# Patient Record
Sex: Female | Born: 1975 | Race: Black or African American | Hispanic: No | Marital: Single | Smoking: Current every day smoker
Health system: Southern US, Community
[De-identification: ages and names within clinical notes are randomized; demographics above are authoritative.]

## PROBLEM LIST (undated history)

## (undated) DIAGNOSIS — F329 Major depressive disorder, single episode, unspecified: Secondary | ICD-10-CM

## (undated) DIAGNOSIS — F32A Depression, unspecified: Secondary | ICD-10-CM

## (undated) DIAGNOSIS — F419 Anxiety disorder, unspecified: Secondary | ICD-10-CM

---

## 2006-08-17 ENCOUNTER — Ambulatory Visit: Payer: Self-pay | Admitting: Obstetrics & Gynecology

## 2008-04-20 ENCOUNTER — Emergency Department: Payer: Self-pay | Admitting: Emergency Medicine

## 2008-05-07 ENCOUNTER — Emergency Department: Payer: Self-pay | Admitting: Emergency Medicine

## 2010-04-16 ENCOUNTER — Observation Stay: Payer: Self-pay | Admitting: Internal Medicine

## 2010-05-11 ENCOUNTER — Ambulatory Visit: Payer: Self-pay

## 2010-09-03 ENCOUNTER — Ambulatory Visit: Payer: Self-pay | Admitting: Neurology

## 2010-09-25 ENCOUNTER — Inpatient Hospital Stay: Payer: Self-pay | Admitting: Internal Medicine

## 2011-02-15 ENCOUNTER — Ambulatory Visit: Payer: Self-pay

## 2017-03-19 ENCOUNTER — Emergency Department: Payer: Self-pay

## 2017-03-19 ENCOUNTER — Emergency Department
Admission: EM | Admit: 2017-03-19 | Discharge: 2017-03-19 | Disposition: A | Discharge status: Home / Self Care | Payer: Self-pay | Attending: Emergency Medicine | Admitting: Emergency Medicine

## 2017-03-19 ENCOUNTER — Encounter: Payer: Self-pay | Admitting: Emergency Medicine

## 2017-03-19 DIAGNOSIS — F17200 Nicotine dependence, unspecified, uncomplicated: Secondary | ICD-10-CM | POA: Insufficient documentation

## 2017-03-19 DIAGNOSIS — N12 Tubulo-interstitial nephritis, not specified as acute or chronic: Secondary | ICD-10-CM | POA: Insufficient documentation

## 2017-03-19 DIAGNOSIS — R109 Unspecified abdominal pain: Secondary | ICD-10-CM | POA: Insufficient documentation

## 2017-03-19 HISTORY — DX: Anxiety disorder, unspecified: F41.9

## 2017-03-19 HISTORY — DX: Depression, unspecified: F32.A

## 2017-03-19 HISTORY — DX: Major depressive disorder, single episode, unspecified: F32.9

## 2017-03-19 LAB — URINALYSIS, COMPLETE (UACMP) WITH MICROSCOPIC
Bilirubin Urine: NEGATIVE
GLUCOSE, UA: NEGATIVE mg/dL
KETONES UR: NEGATIVE mg/dL
NITRITE: NEGATIVE
PH: 7 (ref 5.0–8.0)
Protein, ur: 30 mg/dL — AB
SPECIFIC GRAVITY, URINE: 1.006 (ref 1.005–1.030)

## 2017-03-19 LAB — POCT PREGNANCY, URINE: Preg Test, Ur: NEGATIVE

## 2017-03-19 MED ORDER — LEVOFLOXACIN 500 MG PO TABS: 500.0000 mg | ORAL_TABLET | Freq: Every day | ORAL | 0 refills | Status: AC

## 2017-03-19 MED ORDER — LEVOFLOXACIN 250 MG PO TABS
750.0000 mg | ORAL_TABLET | Freq: Once | ORAL | Status: AC
  Administered 2017-03-19: 750 mg via ORAL
  Filled 2017-03-19: qty 1

## 2017-03-19 MED ORDER — TRAMADOL HCL 50 MG PO TABS: 50.0000 mg | ORAL_TABLET | Freq: Two times a day (BID) | ORAL | 0 refills | Status: AC | PRN

## 2017-03-19 MED ORDER — TRAMADOL HCL 50 MG PO TABS
50.0000 mg | ORAL_TABLET | Freq: Once | ORAL | Status: AC
  Administered 2017-03-19: 50 mg via ORAL
  Filled 2017-03-19: qty 1

## 2017-03-19 NOTE — ED Provider Notes (Signed)
[facility] Emergency Department Provider Note  ____________________________________________  Time seen: Approximately 5:08 PM  I have reviewed the triage vital signs and the nursing notes.   HISTORY  Chief Complaint Urinary Tract Infection    HPI Terri Hanna is a 41 y.o. female presenting to the emergency department with dysuria, increased urinary frequency and hematuria for week. Patient states that she started her menstrual cycle today. Patient has been having right sided flank pain that is acute and 7 out of 10 in intensity. Patient denies a history of nephrolithiasis or pyelonephritis. Patient states that she has only had cystitis 1 other time in her life. She was diagnosed with an STD approximately 25 years ago. Patient denies fever, chills, nausea, vomiting and abdominal pain.   Past Medical History:  Diagnosis Date  . Anxiety   . Depression     There are no active problems to display for this patient.   No past surgical history on file.  Prior to Admission medications   Medication Sig Start Date End Date Taking? Authorizing Provider  levofloxacin (LEVAQUIN) 500 MG tablet Take 1 tablet (500 mg total) by mouth daily. 03/19/17 03/23/17  Orvil Feil, PA-C  traMADol (ULTRAM) 50 MG tablet Take 1 tablet (50 mg total) by mouth every 12 (twelve) hours as needed. 03/19/17 03/24/17  Orvil Feil, PA-C    Allergies Patient has no known allergies.  No family history on file.  Social History Social History  Substance Use Topics  . Smoking status: Current Every Day Smoker    Packs/day: 0.50  . Smokeless tobacco: Never Used  . Alcohol use Yes     Comment: weekends    Review of Systems  Constitutional: No fever/chills Eyes: No visual changes. No discharge ENT: No upper respiratory complaints. Cardiovascular: no chest pain. Respiratory: no cough. No SOB. Gastrointestinal: No abdominal pain.  No nausea, no vomiting.  No diarrhea.  No  constipation. Genitourinary: Patient has had right CVA tenderness, dysuria, hematuria and increased urinary frequency. Skin: Negative for rash, abrasions, lacerations, ecchymosis. Neurological: Negative for headaches, focal weakness or numbness. ____________________________________________   PHYSICAL EXAM:  VITAL SIGNS: ED Triage Vitals  Enc Vitals Group     BP 03/19/17 1524 111/72     Pulse Rate 03/19/17 1524 80     Resp 03/19/17 1524 18     Temp 03/19/17 1524 98.7 F (37.1 C)     Temp Source 03/19/17 1524 Oral     SpO2 03/19/17 1524 100 %     Weight 03/19/17 1524 130 lb (59 kg)     Height 03/19/17 1524  (1.727 m)     Head Circumference --      Peak Flow --      Pain Score 03/19/17 1528 5     Pain Loc --      Pain Edu? --      Excl. in GC? --      Constitutional: Alert and oriented. Well appearing and in no acute distress. Eyes: Conjunctivae are normal. PERRL. EOMI. Head: Atraumatic. Cardiovascular: Normal rate, regular rhythm. Normal S1 and S2.  Good peripheral circulation. Respiratory: Normal respiratory effort without tachypnea or retractions. Lungs CTAB. Good air entry to the bases with no decreased or absent breath sounds. Gastrointestinal: Bowel sounds 4 quadrants. Patient has suprapubic pain. No guarding or rigidity. No palpable masses. No distention. Patient has right-sided CVA tenderness. Musculoskeletal: Full range of motion to all extremities. No gross deformities appreciated. Neurologic:  Normal speech  and language. No gross focal neurologic deficits are appreciated.  Skin:  Skin is warm, dry and intact. No rash noted. Psychiatric: Mood and affect are normal. Speech and behavior are normal. Patient exhibits appropriate insight and judgement.  ____________________________________________   LABS (all labs ordered are listed, but only abnormal results are displayed)  Labs Reviewed  URINALYSIS, COMPLETE (UACMP) WITH MICROSCOPIC - Abnormal; Notable for the  following:       Result Value   Color, Urine YELLOW (*)    APPearance CLOUDY (*)    Hgb urine dipstick LARGE (*)    Protein, ur 30 (*)    Leukocytes, UA LARGE (*)    Bacteria, UA MANY (*)    Squamous Epithelial / LPF 0-5 (*)    All other components within normal limits  POC URINE PREG, ED  POCT PREGNANCY, URINE   ____________________________________________  EKG   ____________________________________________  RADIOLOGY   Ct Renal Stone Study  Result Date: 03/19/2017 CLINICAL DATA:  Right flank and lower abdominopelvic pain today. Dysuria. EXAM: CT ABDOMEN AND PELVIS WITHOUT CONTRAST TECHNIQUE: Multidetector CT imaging of the abdomen and pelvis was performed following the standard protocol without IV contrast. COMPARISON:  None. FINDINGS: Lower chest: No significant pulmonary nodules or acute consolidative airspace disease. Hepatobiliary: Normal liver with no liver mass. Normal gallbladder with no radiopaque cholelithiasis. No biliary ductal dilatation. Pancreas: Normal, with no mass or duct dilation. Spleen: Normal size. No mass. Adrenals/Urinary Tract: Normal adrenals. No right renal stones. No right hydronephrosis. There is urothelial wall thickening involving the right renal pelvis with associated surrounding fat stranding. No left hydronephrosis. Faint hyperdensity within lower left renal pyramids, cannot exclude punctate nonobstructing stones. No contour deforming renal masses. Slight haziness of the right perinephric fat. No discrete perinephric fluid collections. Normal caliber ureters, with no ureteral stones. No bladder stones. No definite bladder wall thickening. Stomach/Bowel: Grossly normal stomach. Normal caliber small bowel with no small bowel wall thickening. Normal appendix. Normal large bowel with no diverticulosis, large bowel wall thickening or pericolonic fat stranding. Vascular/Lymphatic: Atherosclerotic nonaneurysmal abdominal aorta. No pathologically enlarged lymph  nodes in the abdomen or pelvis. Reproductive: Grossly normal uterus. No adnexal mass. Tubal ligation clips are seen in the expected locations bilaterally. Other: No pneumoperitoneum, ascites or focal fluid collection. Musculoskeletal: No aggressive appearing focal osseous lesions. IMPRESSION: 1. Urothelial wall thickening in the right renal pelvis with surrounding fat stranding. Faint right perinephric fat haziness. Findings are suggestive of ascending right urinary tract infection with possible mild acute right pyelonephritis. 2. No hydronephrosis. No right renal stones. Possible punctate nonobstructing lower left renal stones. No ureteral or bladder stones. No definite bladder wall thickening. 3. Aortic atherosclerosis. Electronically Signed   By: Delbert Phenix M.D.   On: 03/19/2017 18:27    ____________________________________________    PROCEDURES  Procedure(s) performed:    Procedures    Medications  levofloxacin (LEVAQUIN) tablet 750 mg (not administered)  traMADol (ULTRAM) tablet 50 mg (50 mg Oral Given 03/19/17 1811)     ____________________________________________   INITIAL IMPRESSION / ASSESSMENT AND PLAN / ED COURSE  Pertinent labs & imaging results that were available during my care of the patient were reviewed by me and considered in my medical decision making (see chart for details).  Review of the [place] CSRS was performed in accordance of the [facility] prior to dispensing any controlled drugs.    Assessment and plan: Pyelonephritis Patient presents to the emergency department with dysuria, increased urinary frequency and hematuria for the past  week. CT renal stone study indicates findings consistent with right pyelonephritis. No obstructing renal stones were visualized on CT renal stone study. Vital signs are reassuring at this time. Patient will be treated as an outpatient. Patient was advised to return to the emergency department immediately if symptoms acutely worsen. Patient  voiced understanding regarding return precautions. All patient questions were answered. ____________________________________________  FINAL CLINICAL IMPRESSION(S) / ED DIAGNOSES  Final diagnoses:  Pyelonephritis      NEW MEDICATIONS STARTED DURING THIS VISIT:  New Prescriptions   LEVOFLOXACIN (LEVAQUIN) 500 MG TABLET    Take 1 tablet (500 mg total) by mouth daily.   TRAMADOL (ULTRAM) 50 MG TABLET    Take 1 tablet (50 mg total) by mouth every 12 (twelve) hours as needed.        This chart was dictated using voice recognition software/Dragon. Despite best efforts to proofread, errors can occur which can change the meaning. Any change was purely unintentional.    MELAYSIA STREED, PA-C 03/19/17 1849    Merrily Brittle, MD 03/19/17 2119

## 2017-03-19 NOTE — ED Notes (Signed)
Patient transported to CT 

## 2017-03-19 NOTE — ED Triage Notes (Signed)
Pt having "tingling" with urination x 1 week, and now burning with urination. States also started her menses today and having cramping

## 2017-03-19 NOTE — ED Notes (Signed)

## 2019-01-18 IMAGING — CT CT RENAL STONE PROTOCOL
3 of 4 series · 8 of 46 positions shown, 15 images · non-contrast
Comparison: None.

CLINICAL DATA: Right flank and lower abdominopelvic pain today.
Dysuria.

EXAM:
CT ABDOMEN AND PELVIS WITHOUT CONTRAST
TECHNIQUE: Multidetector CT imaging of the abdomen and pelvis was performed
following the standard protocol without IV contrast.

[Series 4: lung bases · axial · 0.70mm/px · z∈[-939,-879]mm · 4 of 21 slices shown, 9 images]
[im 5/21  soft-tissue]
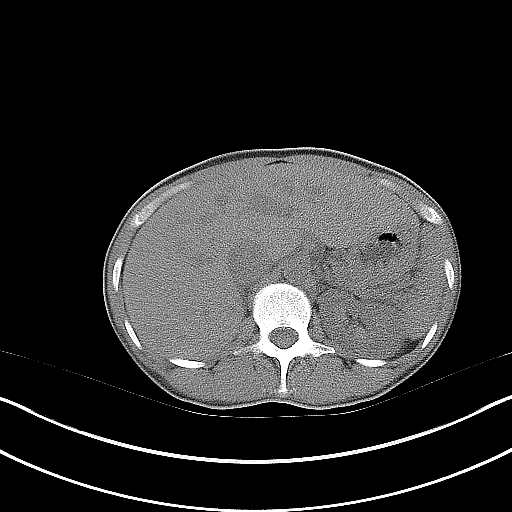
[im 5/21  lung]
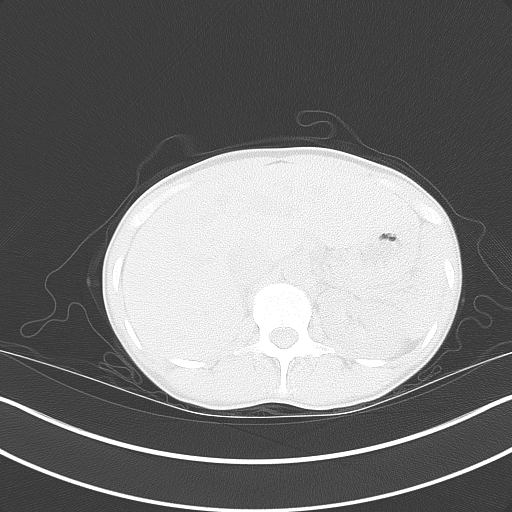
[im 5/21  bone]
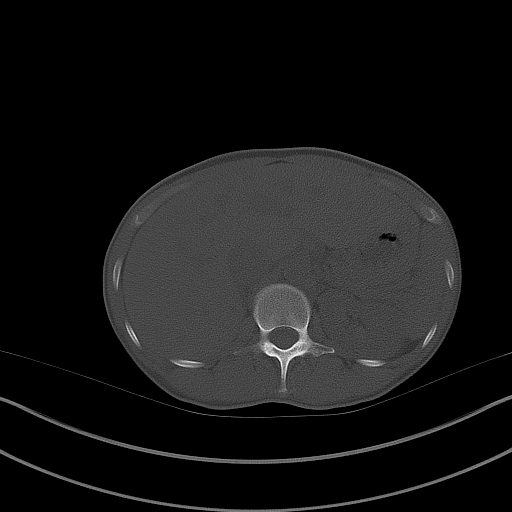
[im 9/21  soft-tissue]
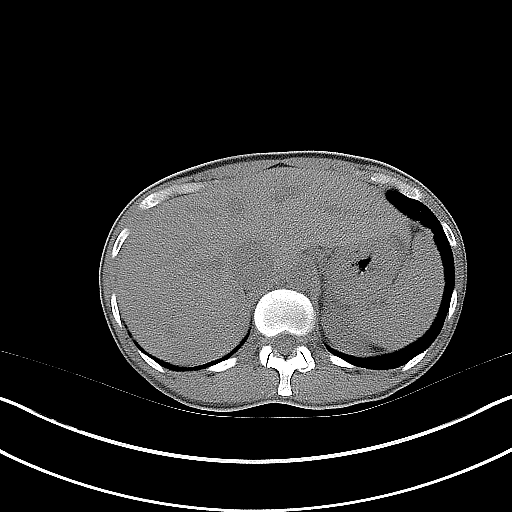
[im 9/21  lung]
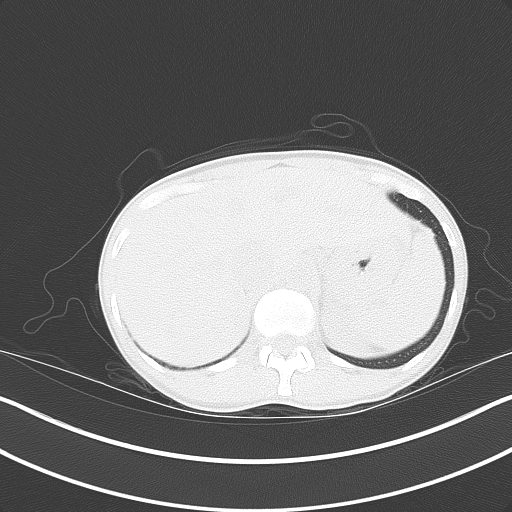
[im 13/21  soft-tissue]
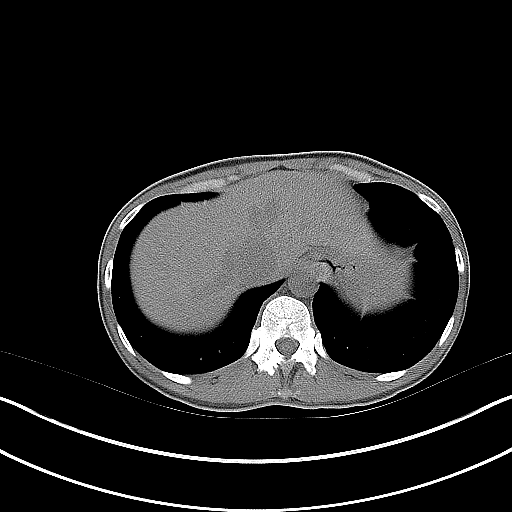
[im 13/21  lung]
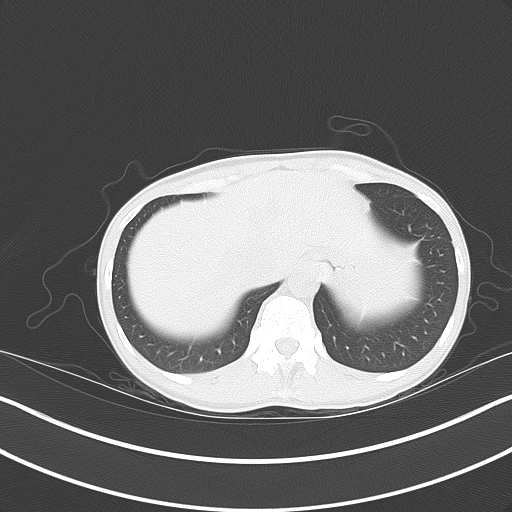
[im 17/21  soft-tissue]
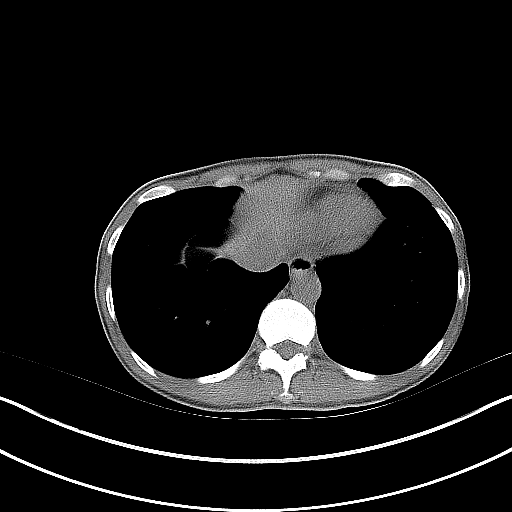
[im 17/21  lung]
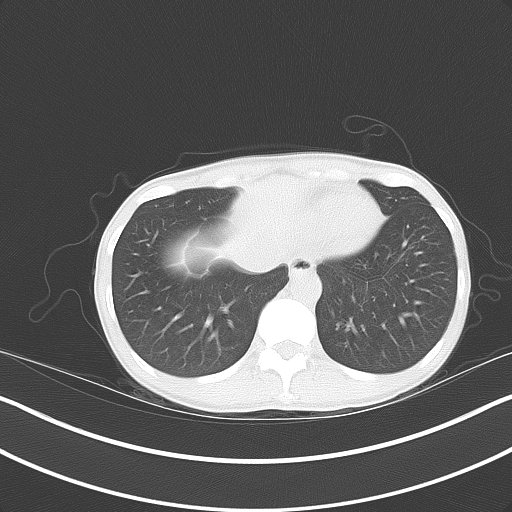

[Series 5: coronal · coronal · 0.67mm/px · 3 of 99 slices shown, 4 images]
[im 33/99  soft-tissue]
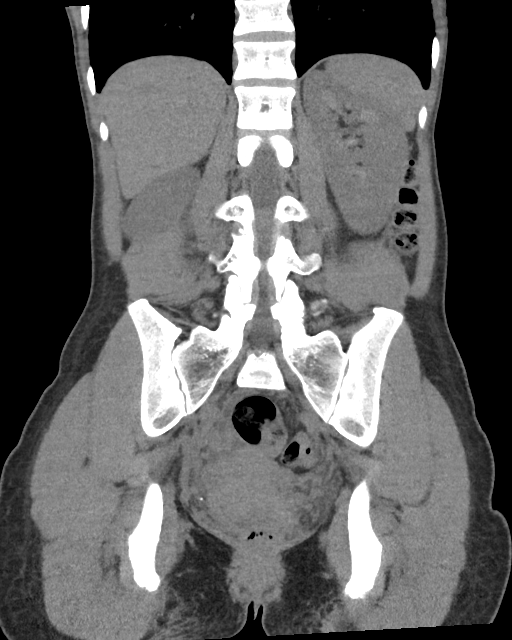
[im 44/99  soft-tissue]
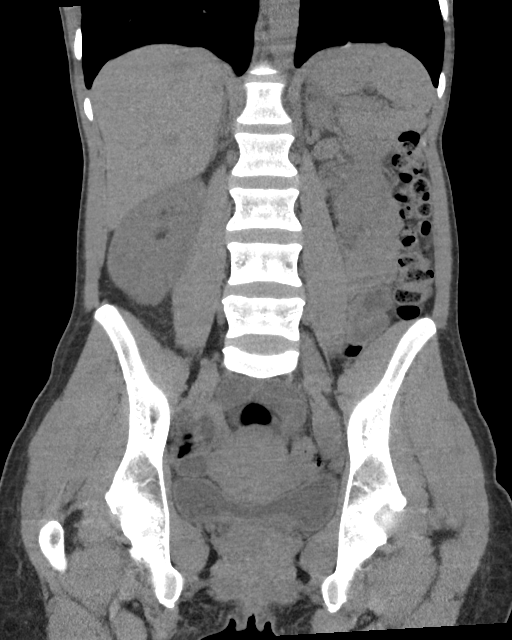
[im 44/99  bone]
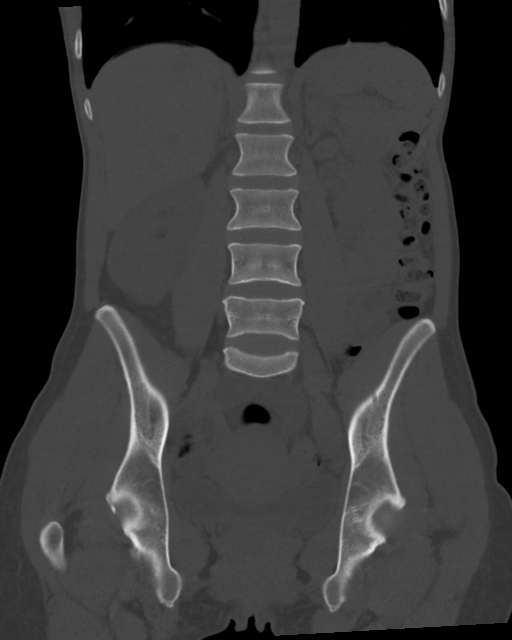
[im 55/99  soft-tissue]
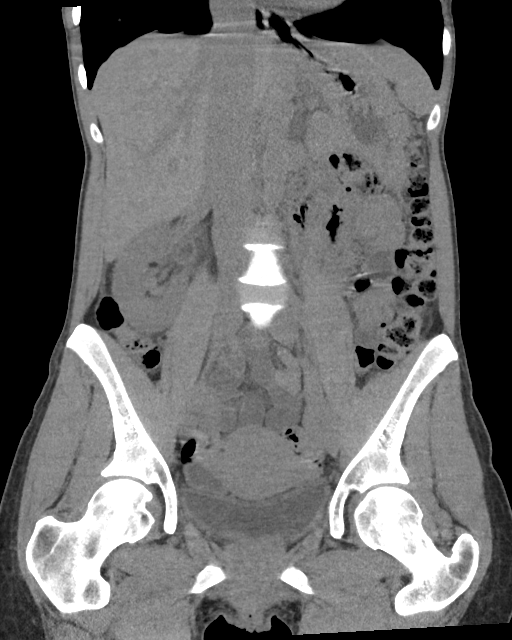

[Series 6: sagittal · sagittal · 0.43mm/px · 1 of 163 slices shown, 2 images]
[im 55/163  soft-tissue]
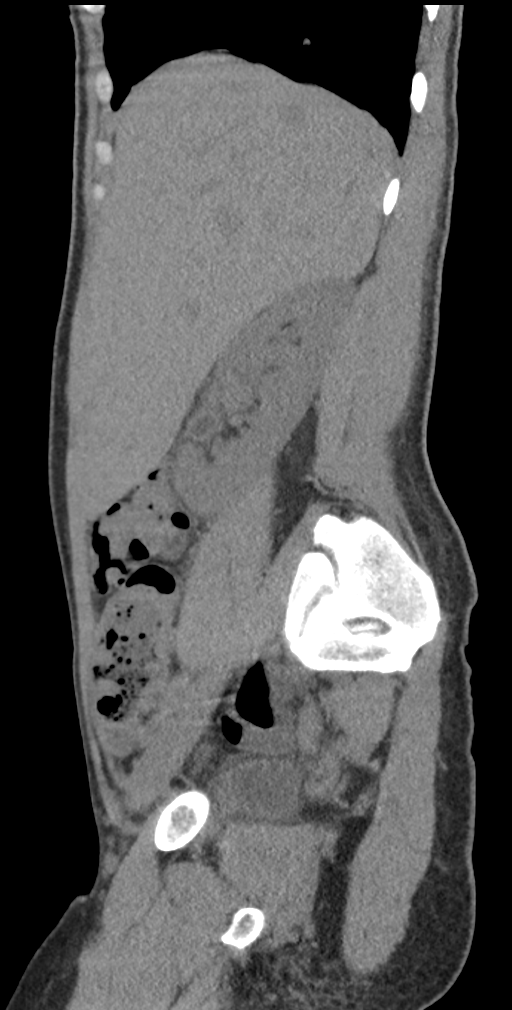
[im 55/163  bone]
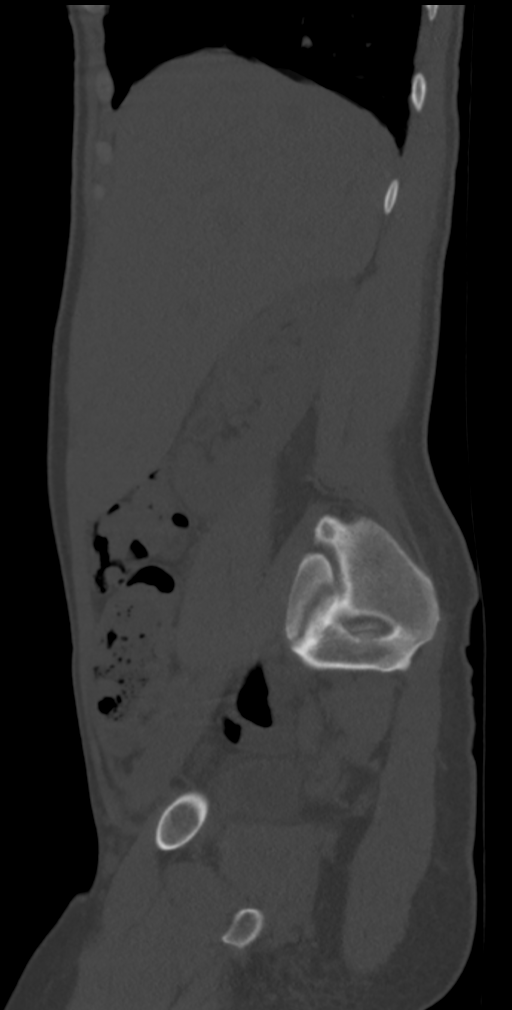

[8 of 46 positions shown; findings below may reference images not displayed]

FINDINGS: Lower chest: No significant pulmonary nodules or acute consolidative
airspace disease.

Hepatobiliary: Normal liver with no liver mass. Normal gallbladder
with no radiopaque cholelithiasis. No biliary ductal dilatation.

Pancreas: Normal, with no mass or duct dilation.

Spleen: Normal size. No mass.

Adrenals/Urinary Tract: Normal adrenals. No right renal stones. No
right hydronephrosis. There is urothelial wall thickening involving
the right renal pelvis with associated surrounding fat stranding. No
left hydronephrosis. Faint hyperdensity within lower left renal
pyramids, cannot exclude punctate nonobstructing stones. No contour
deforming renal masses. Slight haziness of the right perinephric
fat. No discrete perinephric fluid collections. Normal caliber
ureters, with no ureteral stones. No bladder stones. No definite
bladder wall thickening.

Stomach/Bowel: Grossly normal stomach. Normal caliber small bowel
with no small bowel wall thickening. Normal appendix. Normal large
bowel with no diverticulosis, large bowel wall thickening or
pericolonic fat stranding.

Vascular/Lymphatic: Atherosclerotic nonaneurysmal abdominal aorta.
No pathologically enlarged lymph nodes in the abdomen or pelvis.

Reproductive: Grossly normal uterus. No adnexal mass. Tubal ligation
clips are seen in the expected locations bilaterally.

Other: No pneumoperitoneum, ascites or focal fluid collection.

Musculoskeletal: No aggressive appearing focal osseous lesions.
IMPRESSION: 1. Urothelial wall thickening in the right renal pelvis with
surrounding fat stranding. Faint right perinephric fat haziness.
Findings are suggestive of ascending right urinary tract infection
with possible mild acute right pyelonephritis.
2. No hydronephrosis. No right renal stones. Possible punctate
nonobstructing lower left renal stones. No ureteral or bladder
stones. No definite bladder wall thickening.
3. Aortic atherosclerosis.

## 2021-03-04 ENCOUNTER — Other Ambulatory Visit: Payer: Self-pay | Admitting: Family Medicine

## 2021-03-04 ENCOUNTER — Ambulatory Visit: Payer: Self-pay | Admitting: Family Medicine

## 2021-03-04 ENCOUNTER — Other Ambulatory Visit: Payer: Self-pay

## 2021-03-04 ENCOUNTER — Encounter: Payer: Self-pay | Admitting: Family Medicine

## 2021-03-04 DIAGNOSIS — A599 Trichomoniasis, unspecified: Secondary | ICD-10-CM

## 2021-03-04 DIAGNOSIS — Z8659 Personal history of other mental and behavioral disorders: Secondary | ICD-10-CM

## 2021-03-04 DIAGNOSIS — Z113 Encounter for screening for infections with a predominantly sexual mode of transmission: Secondary | ICD-10-CM

## 2021-03-04 LAB — HEPATITIS B SURFACE ANTIGEN

## 2021-03-04 LAB — WET PREP FOR TRICH, YEAST, CLUE
Trichomonas Exam: POSITIVE — AB
Yeast Exam: NEGATIVE

## 2021-03-04 LAB — HIV ANTIBODY (ROUTINE TESTING W REFLEX): HIV 1&2 Ab, 4th Generation: NONREACTIVE

## 2021-03-04 LAB — HM HEPATITIS C SCREENING LAB: HM Hepatitis Screen: NEGATIVE

## 2021-03-04 MED ORDER — METRONIDAZOLE 500 MG PO TABS: 500.0000 mg | ORAL_TABLET | Freq: Two times a day (BID) | ORAL | 0 refills | Status: AC

## 2021-03-04 NOTE — Addendum Note (Signed)
Addended by: Wendi Snipes on: 03/04/2021 04:21 PM   Modules accepted: Orders

## 2021-03-04 NOTE — Progress Notes (Signed)
[facility] Department STI clinic/screening visit  Subjective:  Terri Hanna is a 45 y.o. female being seen today for an STI screening visit. The patient reports they do not have symptoms.  Patient reports that they do not desire a pregnancy in the next year.   They reported they are not interested in discussing contraception today.  No LMP recorded. (Menstrual status: Other).   Patient has the following medical conditions:  There are no problems to display for this patient.   Chief Complaint  Patient presents with  . SEXUALLY TRANSMITTED DISEASE    Referral from [facility] + RPR     HPI  Patient reports here d/t positive RPR from [facility].    Last HIV test per patient/review of record was 17 yrs ago  Patient reports last pap was about 17 years ago.   See flowsheet for further details and programmatic requirements.    The following portions of the patient's history were reviewed and updated as appropriate: allergies, current medications, past medical history, past social history, past surgical history and problem list.  Objective:  There were no vitals filed for this visit.  Physical Exam Vitals and nursing note reviewed.  Constitutional:      Appearance: Normal appearance.  HENT:     Head: Normocephalic and atraumatic.     Mouth/Throat:     Mouth: Mucous membranes are moist.     Pharynx: Oropharynx is clear. No oropharyngeal exudate or posterior oropharyngeal erythema.     Comments: Teeth missing, poor hygiene.   Eyes:     Comments: Left eye prosthesis   Pulmonary:     Effort: Pulmonary effort is normal.  Chest:  Breasts:     Right: No axillary adenopathy or supraclavicular adenopathy.     Left: No axillary adenopathy or supraclavicular adenopathy.    Abdominal:     General: Abdomen is flat.     Palpations: Abdomen is soft. There is no mass.     Tenderness: There is no abdominal tenderness. There is no rebound.     Comments: Abdominal jewelry  present   Genitourinary:    General: Normal vulva.     Exam position: Lithotomy position.     Pubic Area: No rash or pubic lice.      Labia:        Right: No rash or lesion.        Left: No rash or lesion.      Vagina: Normal. No vaginal discharge, erythema, bleeding or lesions.     Cervix: No cervical motion tenderness, discharge, friability, lesion or erythema.     Uterus: Normal.      Adnexa: Right adnexa normal and left adnexa normal.     Rectum: Normal.     Comments: External genitalia without, lice, nits, erythema, edema , lesions or inguinal adenopathy. Evidence of clitoral trauma with likely sloughing skin from prior edema. No open wounds/lacerations. Healing hematoma of clitoral hood and superior labia majora present.  Vagina with normal mucosa and off white discharge and pH >4.  Cervix without visual lesions, uterus firm, mobile, non-tender, no masses, CMT adnexal fullness or tenderness.   Musculoskeletal:     Cervical back: Normal range of motion and neck supple.  Lymphadenopathy:     Head:     Right side of head: No preauricular or posterior auricular adenopathy.     Left side of head: No preauricular or posterior auricular adenopathy.     Cervical: No cervical adenopathy.  Upper Body:     Right upper body: No supraclavicular or axillary adenopathy.     Left upper body: No supraclavicular or axillary adenopathy.     Lower Body: No right inguinal adenopathy. No left inguinal adenopathy.  Skin:    General: Skin is warm and dry.     Findings: No rash.  Neurological:     Mental Status: She is alert and oriented to person, place, and time.  Psychiatric:     Comments: Tearful       Assessment and Plan:  Terri Hanna is a 45 y.o. female presenting to the [facility] Department for STI screening  1. Screening examination for venereal disease Patient is in clinic d/t letter from [place] [facility] with positive RPR.  Discussed with patient, if lab is positive,  will get a call from the state and will contact for treatment.    - Chlamydia/Gonorrhea [facility] - HIV/HCV [place] [facility] - HBV Antigen/Antibody [facility] - Syphilis Serology,  Lab - WET PREP FOR TRICH, YEAST, CLUE  2. History of depression Patient reports history of depression and unable to stay on medications d/t loss of medicaid.   - Ambulatory referral to Behavioral Health  3. Trichimoniasis Wet prep + for Trich.   - metroNIDAZOLE (FLAGYL) 500 MG tablet; Take 1 tablet (500 mg total) by mouth 2 (two) times daily for 7 days.  Dispense: 14 tablet; Refill: 0   Patient accepted all screenings including oral, vaginal CT/GC and bloodwork for HIV/RPR/ HBV/ HCV.  Patient meets criteria for HepB screening? Yes. Ordered? Yes Patient meets criteria for HepC screening? Yes. Ordered? Yes  Wet prep results + for Trich    RN: treat wet prep it + Amine or Clue.  Will wait on rpr treatment until test results are back.  Make sure that phone number is correct.    Discussed time line for State Lab results and that patient will be called with positive results and encouraged patient to call if she had not heard in 2 weeks.  Counseled to return or seek care for continued or worsening symptoms Recommended condom use with all sex  Patient is currently using BTL to prevent pregnancy.      Return for as needed.  No future appointments.  Wendi Snipes, FNP

## 2021-03-09 LAB — GONOCOCCUS CULTURE

## 2021-03-10 NOTE — Progress Notes (Signed)
RN abstracted HIV and Hep C results. Nunzio Banet, RN  

## 2021-03-10 NOTE — Progress Notes (Signed)
RN abstracted Hep B results. Bryna Razavi, RN  

## 2021-03-12 ENCOUNTER — Telehealth: Payer: Self-pay

## 2021-03-12 ENCOUNTER — Telehealth: Payer: Self-pay | Admitting: Family Medicine

## 2021-03-12 NOTE — Telephone Encounter (Signed)
Call to patient, in regards to reactive syphilis result from 03/04/2021. No answer. LMOM to return call at [phone]. RN spoke with Melton Alar (DIS) she is going to do a home visit today and has tried calling and texting patient several times.   Harvie Heck, RN

## 2021-03-12 NOTE — Telephone Encounter (Signed)
Pt was returning call from nurse to discuss STD results

## 2021-03-12 NOTE — Telephone Encounter (Signed)
Call returned to patient. Patient has already spoken with Melton Alar (DIS) and was informed of positive RPR. Patient scheduled for IS appt 03/16/2021. Patient also asked about mental health referral. I confirmed with her that was sent. If she has not heard anything by her appt we can address that then. All questions answered.  Harvie Heck, RN

## 2021-03-16 ENCOUNTER — Ambulatory Visit: Payer: Self-pay

## 2021-03-22 ENCOUNTER — Ambulatory Visit: Payer: Self-pay | Admitting: Physician Assistant

## 2021-03-22 ENCOUNTER — Other Ambulatory Visit: Payer: Self-pay

## 2021-03-22 DIAGNOSIS — A539 Syphilis, unspecified: Secondary | ICD-10-CM

## 2021-03-22 MED ORDER — DOXYCYCLINE HYCLATE 100 MG PO TABS
100.0000 mg | ORAL_TABLET | Freq: Two times a day (BID) | ORAL | 0 refills | Status: AC
Start: 2021-03-22 — End: 2021-04-19

## 2021-03-23 ENCOUNTER — Encounter: Payer: Self-pay | Admitting: Physician Assistant

## 2021-03-23 DIAGNOSIS — A539 Syphilis, unspecified: Secondary | ICD-10-CM

## 2021-03-23 HISTORY — DX: Syphilis, unspecified: A53.9

## 2021-03-23 NOTE — Progress Notes (Signed)
S:  Patient RTC for treatment of Syphilis today.  States that she was sent here after her screening test at the plasma center where she was going to donate came back positive for Syphilis.  Reports that her last sex was in early February and that she has had a BTL for [facility].  States allergy to PCN and Sulfa medications but does not know what she has as a reaction. O:  WDWN female in NAD, A&O x 3, normal work of breathing. RPR from last visit reactive 1:128 and confirmatory testing was reactive. A/P:  1.  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 2.  Due to allergy and unknown reaction to PCN will give Doxycycline 100 mg #56 1 po BID for 4 weeks. The patient was dispensed Doxycycline today. I provided counseling today regarding the medication. We discussed the medication, the side effects and when to call clinic. Patient given the opportunity to ask questions. Questions answered.  3.  No sex for 4 weeks and until after partner completes treatment. 4.  Enc condoms with all sex for STD protection. 5.  RTC 6 months after completing medicine for titer check. 6.  Call with questions or concerns.

## 2021-05-12 NOTE — Addendum Note (Signed)
Addended by: Heywood Bene on: 05/12/2021 03:41 PM   Modules accepted: Orders

## 2021-07-20 ENCOUNTER — Other Ambulatory Visit: Payer: Self-pay

## 2021-07-20 ENCOUNTER — Emergency Department
Admission: EM | Admit: 2021-07-20 | Discharge: 2021-07-20 | Disposition: A | Discharge status: Home / Self Care | Payer: Self-pay | Attending: Emergency Medicine | Admitting: Emergency Medicine

## 2021-07-20 ENCOUNTER — Encounter: Payer: Self-pay | Admitting: Emergency Medicine

## 2021-07-20 DIAGNOSIS — F17210 Nicotine dependence, cigarettes, uncomplicated: Secondary | ICD-10-CM | POA: Insufficient documentation

## 2021-07-20 DIAGNOSIS — K047 Periapical abscess without sinus: Secondary | ICD-10-CM

## 2021-07-20 DIAGNOSIS — L0291 Cutaneous abscess, unspecified: Secondary | ICD-10-CM

## 2021-07-20 LAB — CBC WITH DIFFERENTIAL/PLATELET
Abs Immature Granulocytes: 0.02 10*3/uL (ref 0.00–0.07)
Basophils Absolute: 0 10*3/uL (ref 0.0–0.1)
Basophils Relative: 0 %
Eosinophils Absolute: 0.1 10*3/uL (ref 0.0–0.5)
Eosinophils Relative: 1 %
HCT: 39.1 % (ref 36.0–46.0)
Hemoglobin: 12.4 g/dL (ref 12.0–15.0)
Immature Granulocytes: 0 %
Lymphocytes Relative: 29 %
Lymphs Abs: 2 10*3/uL (ref 0.7–4.0)
MCH: 23.1 pg — ABNORMAL LOW (ref 26.0–34.0)
MCHC: 31.7 g/dL (ref 30.0–36.0)
MCV: 72.8 fL — ABNORMAL LOW (ref 80.0–100.0)
Monocytes Absolute: 0.4 10*3/uL (ref 0.1–1.0)
Monocytes Relative: 6 %
Neutro Abs: 4.5 10*3/uL (ref 1.7–7.7)
Neutrophils Relative %: 64 %
Platelets: 227 10*3/uL (ref 150–400)
RBC: 5.37 MIL/uL — ABNORMAL HIGH (ref 3.87–5.11)
RDW: 14.5 % (ref 11.5–15.5)
WBC: 7 10*3/uL (ref 4.0–10.5)
nRBC: 0 % (ref 0.0–0.2)

## 2021-07-20 LAB — BASIC METABOLIC PANEL
Anion gap: 6 (ref 5–15)
BUN: 10 mg/dL (ref 6–20)
CO2: 29 mmol/L (ref 22–32)
Calcium: 9.1 mg/dL (ref 8.9–10.3)
Chloride: 104 mmol/L (ref 98–111)
Creatinine, Ser: 0.63 mg/dL (ref 0.44–1.00)
GFR, Estimated: >60 mL/min (ref >60)
Glucose, Bld: 94 mg/dL (ref 70–99)
Potassium: 3.8 mmol/L (ref 3.5–5.1)
Sodium: 139 mmol/L (ref 135–145)

## 2021-07-20 MED ORDER — CLINDAMYCIN HCL 150 MG PO CAPS: 450.0000 mg | ORAL_CAPSULE | Freq: Three times a day (TID) | ORAL | 0 refills | Status: AC

## 2021-07-20 MED ORDER — OXYCODONE HCL 5 MG PO TABS: 5.0000 mg | ORAL_TABLET | Freq: Four times a day (QID) | ORAL | 0 refills | Status: AC | PRN

## 2021-07-20 MED ORDER — CLINDAMYCIN HCL 150 MG PO CAPS
450.0000 mg | ORAL_CAPSULE | Freq: Once | ORAL | Status: AC
  Administered 2021-07-20: 450 mg via ORAL
  Filled 2021-07-20: qty 3

## 2021-07-20 NOTE — ED Provider Notes (Signed)
[facility] Emergency Department Provider Note  ____________________________________________   Event Date/Time   First MD Initiated Contact with Patient 07/20/21 2126     (approximate)  I have reviewed the triage vital signs and the nursing notes.   HISTORY  Chief Complaint Abscess    HPI Terri Hanna is a 45 y.o. female with anxiety, depression, prior syphilis who comes in with concern for swelling on the bottom right upper jaw.  Patient reports having a tooth that fell out over a year ago.  She states that today that she woke up and she had swelling to the right side of her jaw.  She opens her mouth and points to inside of her mouth says that it is really fluctuant there.  Reports some pain is constant, worse with opening her mouth, moderate.  She denies any difficulty swallowing.  Denies any alcohol or drug use except for marijuana occasionally.           Past Medical History:  Diagnosis Date   Anxiety    Depression    Syphilis 03/23/2021   Syphilis of unknown duration with Reactive RPR and confirmatory testing.  Due to reported allergy and unknown reaction patient treated with Doxycycline 100 mg #56 1 po BID for 28d.    Patient Active Problem List   Diagnosis Date Noted   Syphilis 03/23/2021    History reviewed. No pertinent surgical history.  Prior to Admission medications   Not on File    Allergies Penicillins and Sulfa antibiotics  No family history on file.  Social History Social History   Tobacco Use   Smoking status: Every Day    Packs/day: 0.50    Types: Cigarettes   Smokeless tobacco: Never  Vaping Use   Vaping Use: Never used  Substance Use Topics   Alcohol use: Yes    Alcohol/week: 1.0 standard drink    Types: 1 Glasses of wine per week    Comment: weekends   Drug use: Yes    Frequency: 3.0 times per week    Types: Marijuana      Review of Systems Constitutional: No fever/chills Eyes: No visual  changes. ENT: No sore throat. Cardiovascular: Denies chest pain. Respiratory: Denies shortness of breath. Gastrointestinal: No abdominal pain.  No nausea, no vomiting.  No diarrhea.  No constipation. Genitourinary: Negative for dysuria. Musculoskeletal: Negative for back pain. Skin: Negative for rash. Neurological: Negative for headaches, focal weakness or numbness. All other ROS negative ____________________________________________   PHYSICAL EXAM:  VITAL SIGNS: ED Triage Vitals  Enc Vitals Group     BP 07/20/21 1927 (!) 120/95     Pulse Rate 07/20/21 1927 87     Resp 07/20/21 1927 18     Temp 07/20/21 1927 98.6 F (37 C)     Temp Source 07/20/21 1927 Oral     SpO2 07/20/21 1927 100 %     Weight 07/20/21 1926 125 lb (56.7 kg)     Height 07/20/21 1926 5\' 8"  (1.727 m)     Head Circumference --      Peak Flow --      Pain Score 07/20/21 1926 7     Pain Loc --      Pain Edu? --      Excl. in GC? --     Constitutional: Alert and oriented. Well appearing and in no acute distress. Eyes: Conjunctivae are normal. EOMI. Head: Atraumatic. Nose: No congestion/rhinnorhea. Mouth/Throat: Mucous membranes are moist.  Patient has  absent tooth noted on the back right molar with large fluctuant mass noted.  Apically.  There is a little bit of swelling of the right side of the jaw but does not extend into the mandible. Neck: No stridor. Trachea Midline. FROM Cardiovascular: Normal rate, regular rhythm. Grossly normal heart sounds.  Good peripheral circulation. Respiratory: Normal respiratory effort.  No retractions. Lungs CTAB. Gastrointestinal: Soft and nontender. No distention. No abdominal bruits.  Musculoskeletal: No lower extremity tenderness nor edema.  No joint effusions. Neurologic:  Normal speech and language. No gross focal neurologic deficits are appreciated.  Skin:  Skin is warm, dry and intact. No rash noted. Psychiatric: Mood and affect are normal. Speech and behavior are  normal. GU: Deferred   ____________________________________________   LABS (all labs ordered are listed, but only abnormal results are displayed)  Labs Reviewed  CBC WITH DIFFERENTIAL/PLATELET - Abnormal; Notable for the following components:      Result Value   RBC 5.37 (*)    MCV 72.8 (*)    MCH 23.1 (*)    All other components within normal limits  BASIC METABOLIC PANEL   _____  PROCEDURES  Procedure(s) performed (including Critical Care):  Marland KitchenMarland KitchenIncision and Drainage  Date/Time: 07/20/2021 10:40 PM Performed by: Concha Se, MD Authorized by: Concha Se, MD   Consent:    Consent obtained:  Verbal   Consent given by:  Patient   Risks discussed:  Bleeding, damage to other organs, infection, incomplete drainage and pain   Alternatives discussed:  No treatment Location:    Type:  Abscess   Location:  Mouth   Mouth location:  Alveolar process Procedure type:    Complexity:  Simple Procedure details:    Incision types:  Stab incision   Drainage:  Purulent   Packing materials:  None Post-procedure details:    Procedure completion:  Tolerated   ____________________________________________   INITIAL IMPRESSION / ASSESSMENT AND PLAN / ED COURSE  [name] was evaluated in Emergency Department on 07/20/2021 for the symptoms described in the history of present illness. She was evaluated in the context of the global COVID-19 pandemic, which necessitated consideration that the patient might be at risk for infection with the SARS-CoV-2 virus that causes COVID-19. Institutional protocols and algorithms that pertain to the evaluation of patients at risk for COVID-19 are in a state of rapid change based on information released by regulatory bodies including the CDC and federal and state organizations. These policies and algorithms were followed during the patient's care in the ED.    Exam consistent w/ a periapical abscess.  Pretty impressive size and discussed CT imaging  to make sure it did not go up any other spaces that would require IV antibiotics.  Patient is adamant that she do not want a CT scan.  She states that she cannot stay any longer.  There is no evidence of Ludwick's angina and there is an obvious fluctuation that she would prefer that I just incise rather than do the CT scan.  She understands the risk of missing deeper infections but she is afebrile and white count was normal so it seems less likely for something more severe and her exam is consistent with a periapical abscess.  I did do an I&D with purulent discharge and patient reports immediate relief.  We will treat patient with a course of clindamycin due to penicillin allergy.  Patient was instructed to follow-up with dentistry and I gave her a few oxycodone to take  to help with pain.  Understands not to drive or work while on the        ____________________________________________   FINAL CLINICAL IMPRESSION(S) / ED DIAGNOSES   Final diagnoses:  Abscess  Dental abscess      MEDICATIONS GIVEN DURING THIS VISIT:  Medications - No data to display   ED Discharge Orders          Ordered    clindamycin (CLEOCIN) 150 MG capsule  3 times daily        07/20/21 2244    oxyCODONE (ROXICODONE) 5 MG immediate release tablet  Every 6 hours PRN        07/20/21 2244             Note:  This document was prepared using Dragon voice recognition software and may include unintentional dictation errors.    Concha Se, MD 07/20/21 [phone]

## 2021-07-20 NOTE — ED Triage Notes (Signed)
Pt states she woke up this morning and her bottom right jaw was swollen, pt states since the swelling has gone down some but she has pain to the area. Pt states last year she "ate her tooth off" and that is where the swelling is coming from now. Pt denies having any issues with tooth in the past. Pt denies difficulty breathing, states she has some difficulty swallowing. Pt able to speak in complete sentences not distress noted.

## 2021-07-20 NOTE — Discharge Instructions (Addendum)
You need to follow-up emergently with the dental service.  You can take the antibiotics to help.  Take Tylenol 1 g every 8 hours, ibuprofen to help with pain you can use the oxycodone for breakthrough pain.  Return to the ER if develop fevers, worsening swelling or any other concerns  Take oxycodone as prescribed. Do not drink alcohol, drive or participate in any other potentially dangerous activities while taking this medication as it may make you sleepy. Do not take this medication with any other sedating medications, either prescription or over-the-counter. If you were prescribed Percocet or Vicodin, do not take these with acetaminophen (Tylenol) as it is already contained within these medications.  This medication is an opiate (or narcotic) pain medication and can be habit forming. Use it as little as possible to achieve adequate pain control. Do not use or use it with extreme caution if you have a history of opiate abuse or dependence. If you are on a pain contract with your primary care doctor or a pain specialist, be sure to let them know you were prescribed this medication today from the Emergency Department. This medication is intended for your use only - do not give any to anyone else and keep it in a secure place where nobody else, especially children, have access to it.    OPTIONS FOR DENTAL FOLLOW UP CARE  [place] Department of Health and Human Services - Local Safety Net Dental Clinics [email or web address]   [facility] [phone])  Sharl Ma [phone])  [facility] [phone])  [facility] [phone])  [facility] [phone]) This clinic caters to the indigent population and is on a lottery system. Location: [employer] of Dentistry, [employer], [address] [place] Clinic Hours: Wednesdays from 6pm - 9pm, patients seen by a lottery  system. For dates, call or go to [email or web address] Services: Cleanings, fillings and simple extractions. Payment Options: DENTAL WORK IS FREE OF CHARGE. Bring proof of income or support. Best way to get seen: Arrive at 5:15 pm - this is a lottery, NOT first come/first serve, so arriving earlier will not increase your chances of being seen.     [facility] [phone] Select option 1 for emergencies   Location: [facility] of Dentistry, [place], [address] [place] Clinic Hours: No walk-ins accepted - call the day before to schedule an appointment. Check in times are 9:30 am and 1:30 pm. Services: Simple extractions, temporary fillings, pulpectomy/pulp debridement, uncomplicated abscess drainage. Payment Options: PAYMENT IS DUE AT THE TIME OF SERVICE.  Fee is usually $100-200, additional surgical procedures (e.g. abscess drainage) may be extra. Cash, checks, Visa/MasterCard accepted.  Can file Medicaid if patient is covered for dental - patient should call case worker to check. No discount for [facility] patients. Best way to get seen: MUST call the day before and get onto the schedule. Can usually be seen the next 1-2 days. No walk-ins accepted.     [facility] [phone]   Location: [facility], [address] [place] Clinic Hours: M, W, Th, F 8am or 1:30pm, Tues 9a or 1:30 - first come/first served. Services: Simple extractions, temporary fillings, uncomplicated abscess drainage.  You do not need to be an [facility] resident. Payment Options: PAYMENT IS DUE AT THE TIME OF SERVICE. Dental insurance, otherwise sliding scale - bring proof of income or support. Depending on income and treatment needed, cost is  usually $50-200. Best way to get seen: Arrive early as it is first come/first served.     [facility] [phone]    Location: [address] Clinic Hours: Mon-Thu 8a-5p Services: Most basic dental services including extractions and fillings. Payment Options: PAYMENT IS DUE AT THE TIME OF SERVICE. Sliding scale, up to 50% off - bring proof if income or support. Medicaid with dental option accepted. Best way to get seen: Call to schedule an appointment, can usually be seen within 2 weeks OR they will try to see walk-ins - show up at 8a or 2p (you may have to wait).     [facility] [phone] [place] RESIDENTS ONLY   Location: [facility], [address] [place], [place] [address] Clinic Hours: By appointment only. Monday - Thursday 8am-5pm, Friday 8am-12pm Services: Cleanings, fillings, extractions. Payment Options: PAYMENT IS DUE AT THE TIME OF SERVICE. Cash, Visa or MasterCard. Sliding scale - $30 minimum per service. Best way to get seen: Come in to office, complete packet and make an appointment - need proof of income or support monies for each household member and proof of [facility] residence. Usually takes about a month to get in.     [facility] [phone]   Location: [address], [facility] Clinic Hours: Walk-in Urgent Care Dental Services are offered Monday-Friday mornings only. The numbers of emergencies accepted daily is limited to the number of providers available. Maximum 15 - Mondays, Wednesdays & Thursdays Maximum 10 - Tuesdays & Fridays Services: You do not need to be a [facility] resident to be seen for a dental emergency. Emergencies are defined as pain, swelling, abnormal bleeding, or dental trauma. Walkins will receive x-rays if needed. NOTE: Dental cleaning is not an emergency. Payment Options: PAYMENT IS DUE AT THE TIME OF SERVICE. Minimum co-pay is $40.00 for uninsured patients. Minimum co-pay is $3.00 for Medicaid with dental coverage. Dental Insurance is  accepted and must be presented at time of visit. Medicare does not cover dental. Forms of payment: Cash, credit card, checks. Best way to get seen: If not previously registered with the clinic, walk-in dental registration begins at 7:15 am and is on a first come/first serve basis. If previously registered with the clinic, call to make an appointment.     [facility] [phone] [place] RESIDENTS ONLY   Location: [address], [place] [place], [place] Clinic Hours: Mon-Thu 10a-2p Services: Extractions only! Payment Options: FREE (donations accepted) - bring proof of income or support Best way to get seen: Call and schedule an appointment OR come at 8am on the 1st Monday of every month (except for holidays) when it is first come/first served.     [facility] [phone]   Location: [address] [place], [place] Clinic Hours: Friday mornings Services, Payment Options, Best way to get seen: Call for info

## 2021-07-23 LAB — AEROBIC CULTURE W GRAM STAIN (SUPERFICIAL SPECIMEN): Culture: NORMAL

## 2022-10-16 ENCOUNTER — Encounter: Payer: Self-pay | Admitting: Emergency Medicine

## 2022-10-16 ENCOUNTER — Other Ambulatory Visit: Payer: Self-pay

## 2022-10-16 ENCOUNTER — Emergency Department
Admission: EM | Admit: 2022-10-16 | Discharge: 2022-10-16 | Disposition: A | Discharge status: Home / Self Care | Payer: Self-pay | Attending: Emergency Medicine | Admitting: Emergency Medicine

## 2022-10-16 DIAGNOSIS — N3091 Cystitis, unspecified with hematuria: Secondary | ICD-10-CM | POA: Insufficient documentation

## 2022-10-16 DIAGNOSIS — K047 Periapical abscess without sinus: Secondary | ICD-10-CM

## 2022-10-16 DIAGNOSIS — N3090 Cystitis, unspecified without hematuria: Secondary | ICD-10-CM

## 2022-10-16 LAB — URINALYSIS, ROUTINE W REFLEX MICROSCOPIC
Bilirubin Urine: NEGATIVE
Glucose, UA: NEGATIVE mg/dL
Ketones, ur: NEGATIVE mg/dL
Nitrite: NEGATIVE
Protein, ur: 100 mg/dL — AB
Specific Gravity, Urine: 1.013 (ref 1.005–1.030)
WBC, UA: >50 WBC/hpf — ABNORMAL HIGH (ref 0–5)
pH: 5 (ref 5.0–8.0)

## 2022-10-16 LAB — POC URINE PREG, ED: Preg Test, Ur: NEGATIVE

## 2022-10-16 MED ORDER — CEFDINIR 300 MG PO CAPS: 300.0000 mg | ORAL_CAPSULE | Freq: Two times a day (BID) | ORAL | 0 refills | Status: AC

## 2022-10-16 NOTE — Discharge Instructions (Addendum)
Follow-up with your primary care provider if any continued urinary symptoms.  A prescription for Omnicef was sent to the pharmacy to take twice a day for the next 10 days.  This medication should also help with your dental pain however you still need to see a dentist.  Increase fluids and take Tylenol as needed for pain or discomfort.  OPTIONS FOR DENTAL FOLLOW UP CARE  [place] [facility] of [facility] and [place] [email or web address] [facility] [phone])  [facility] [phone])  [place] [phone])  [place] [phone])  [facility] [phone]) This clinic caters to the indigent population and is on a lottery system. Location: [employer] of Dentistry, [employer], [place], [place] Clinic Hours: Wednesdays from 6pm - 9pm, patients seen by a lottery system. For dates, call or go to [email or web address] Services: Cleanings, fillings and simple extractions. Payment Options: DENTAL WORK IS FREE OF CHARGE. Bring proof of income or support. Best way to get seen: Arrive at 5:15 pm - this is a lottery, NOT first come/first serve, so arriving earlier will not increase your chances of being seen.     [facility] [phone] Select option 1 for emergencies   Location: [facility], [place], [address], [place] Clinic Hours: No walk-ins accepted - call the day before to schedule an appointment. Check in times are 9:30 am and 1:30 pm. Services: Simple extractions, temporary fillings, pulpectomy/pulp debridement, uncomplicated abscess drainage. Payment Options: PAYMENT IS DUE AT THE TIME OF SERVICE.  Fee is usually $100-200, additional surgical procedures (e.g. abscess drainage) may be extra. Cash, checks, Visa/MasterCard  accepted.  Can file Medicaid if patient is covered for dental - patient should call case worker to check. No discount for [facility] patients. Best way to get seen: MUST call the day before and get onto the schedule. Can usually be seen the next 1-2 days. No walk-ins accepted.     [facility] [phone]   Location: [place], [place] Clinic Hours: M, W, Th, F 8am or 1:30pm, Tues 9a or 1:30 - first come/first served. Services: Simple extractions, temporary fillings, uncomplicated abscess drainage.  You do not need to be an [facility] resident. Payment Options: PAYMENT IS DUE AT THE TIME OF SERVICE. Dental insurance, otherwise sliding scale - bring proof of income or support. Depending on income and treatment needed, cost is usually $50-200. Best way to get seen: Arrive early as it is first come/first served.     [facility] [phone]   Location: [place] Clinic Hours: Mon-Thu 8a-5p Services: Most basic dental services including extractions and fillings. Payment Options: PAYMENT IS DUE AT THE TIME OF SERVICE. Sliding scale, up to 50% off - bring proof if income or support. Medicaid with dental option accepted. Best way to get seen: Call to schedule an appointment, can usually be seen within 2 weeks OR they will try to see walk-ins - show up at [place] or 2p (you may have to wait).     [facility] [place] RESIDENTS ONLY   Location: [facility], [place] [address], [place], [place] [address] Clinic Hours: By appointment only. Monday - Thursday 8am-5pm, Friday 8am-12pm Services: Cleanings, fillings, extractions. Payment Options: PAYMENT IS DUE AT THE TIME OF SERVICE. Cash, Visa or MasterCard. Sliding scale - $  30 minimum per service. Best way to get seen: Come in to office, complete packet and make an  appointment - need proof of income or support monies for each household member and proof of [facility] residence. Usually takes about a month to get in.     [facility] [phone]   Location: [address] [place] Clinic Hours: Walk-in Urgent Care Dental Services are offered Monday-Friday mornings only. The numbers of emergencies accepted daily is limited to the number of providers available. Maximum 15 - Mondays, Wednesdays & Thursdays Maximum 10 - Tuesdays & Fridays Services: You do not need to be a [facility] resident to be seen for a dental emergency. Emergencies are defined as pain, swelling, abnormal bleeding, or dental trauma. Walkins will receive x-rays if needed. NOTE: Dental cleaning is not an emergency. Payment Options: PAYMENT IS DUE AT THE TIME OF SERVICE. Minimum co-pay is $40.00 for uninsured patients. Minimum co-pay is $3.00 for Medicaid with dental coverage. Dental Insurance is accepted and must be presented at time of visit. Medicare does not cover dental. Forms of payment: Cash, credit card, checks. Best way to get seen: If not previously registered with the clinic, walk-in dental registration begins at 7:15 am and is on a first come/first serve basis. If previously registered with the clinic, call to make an appointment.     [facility] [place] ONLY   Location: [address] [place], [place] Clinic Hours: Mon-Thu 10a-2p Services: Extractions only! Payment Options: FREE (donations accepted) - bring proof of income or support Best way to get seen: Call and schedule an appointment OR come at 8am on the 1st Monday of every month (except for holidays) when it is first come/first served.     [facility] [phone]   Location: [place], [place] Clinic Hours: Friday mornings Services, Payment Options, Best way to get seen: Call for info

## 2022-10-16 NOTE — ED Provider Notes (Signed)
[facility] Provider Note    Event Date/Time   First MD Initiated Contact with Patient 10/16/22 1342     (approximate)   History   Urinary Frequency   HPI  Terri Hanna is a 46 y.o. female   presents to the ED with complaint of frequent urination, dysuria and some hematuria.  Patient states that she is possibly had a low-grade temp at home but no nausea or vomiting.  Patient also complains of an abscess tooth on the left that has been bothering her for some time.  Medical history includes depression, anxiety and syphilis.      Physical Exam   Triage Vital Signs: ED Triage Vitals  Enc Vitals Group     BP 10/16/22 1230 111/67     Pulse Rate 10/16/22 1230 98     Resp 10/16/22 1230 18     Temp 10/16/22 1230 98.1 F (36.7 C)     Temp Source 10/16/22 1230 Oral     SpO2 10/16/22 1230 100 %     Weight 10/16/22 1341 125 lb (56.7 kg)     Height 10/16/22 1341 5\' 8"  (1.727 m)     Head Circumference --      Peak Flow --      Pain Score 10/16/22 1241 9     Pain Loc --      Pain Edu? --      Excl. in GC? --     Most recent vital signs: Vitals:   10/16/22 1230  BP: 111/67  Pulse: 98  Resp: 18  Temp: 98.1 F (36.7 C)  SpO2: 100%     General: Awake, no distress.  CV:  Good peripheral perfusion.  Resp:  Normal effort.  Abd:  No distention.  Other:  Left upper posterior molar and gum in poor repair.  Gum is edematous but no active drainage is noted.   ED Results / Procedures / Treatments   Labs (all labs ordered are listed, but only abnormal results are displayed) Labs Reviewed  URINALYSIS, ROUTINE W REFLEX MICROSCOPIC - Abnormal; Notable for the following components:      Result Value   Color, Urine YELLOW (*)    APPearance CLOUDY (*)    Hgb urine dipstick LARGE (*)    Protein, ur 100 (*)    Leukocytes,Ua LARGE (*)    WBC, UA >50 (*)    Bacteria, UA RARE (*)    Non Squamous Epithelial PRESENT (*)    All other components within  normal limits  POC URINE PREG, ED  POC URINE PREG, ED     PROCEDURES:  Critical Care performed:   Procedures   MEDICATIONS ORDERED IN ED: Medications - No data to display   IMPRESSION / MDM / ASSESSMENT AND PLAN / ED COURSE  I reviewed the triage vital signs and the nursing notes.   Differential diagnosis includes, but is not limited to, urinary tract infection, cystitis, vaginal infection, dental abscess, dental pain.  46 year old female presents to the ED with complaint of urinary frequency and dysuria for 2 days.  Patient also complains of a dental abscess for which she does not have a dentist and has not been seen.  No fever, chills, nausea or vomiting.  Urinalysis shows 21-50 RBCs and greater than 50 WBCs.  Pregnancy test was negative.  A prescription for cefdinir 300 mg twice daily for 10 days was sent to the pharmacy for patient to begin taking today.  Also a list  of dental clinics in the area was listed on her discharge papers and she is strongly encouraged to make an appointment to follow-up.      Patient's presentation is most consistent with acute complicated illness / injury requiring diagnostic workup.  FINAL CLINICAL IMPRESSION(S) / ED DIAGNOSES   Final diagnoses:  Cystitis  Dental abscess     Rx / DC Orders   ED Discharge Orders          Ordered    cefdinir (OMNICEF) 300 MG capsule  2 times daily        10/16/22 1416             Note:  This document was prepared using Dragon voice recognition software and may include unintentional dictation errors.   Johnn Hai, PA-C 10/16/22 1421    Blake Divine, MD 10/16/22 1900

## 2022-10-16 NOTE — ED Triage Notes (Addendum)
Pt with frequent urination, "stinging" and blood in her urine with some right side pain.  Pt states she has had bladder/kidney infections in the past and this feels similar.  Pt also believes she has an abscessed tooth on the left.

## 2025-01-06 ENCOUNTER — Emergency Department
Admission: EM | Admit: 2025-01-06 | Discharge: 2025-01-06 | Disposition: A | Discharge status: Home / Self Care | Attending: Emergency Medicine | Admitting: Emergency Medicine

## 2025-01-06 ENCOUNTER — Other Ambulatory Visit: Payer: Self-pay

## 2025-01-06 DIAGNOSIS — K0889 Other specified disorders of teeth and supporting structures: Secondary | ICD-10-CM | POA: Diagnosis present

## 2025-01-06 DIAGNOSIS — K047 Periapical abscess without sinus: Secondary | ICD-10-CM | POA: Insufficient documentation

## 2025-01-06 MED ORDER — OXYCODONE HCL 5 MG PO TABS
5.0000 mg | ORAL_TABLET | Freq: Once | ORAL | Status: AC
  Administered 2025-01-06: 5 mg via ORAL
  Filled 2025-01-06: qty 1

## 2025-01-06 MED ORDER — CLINDAMYCIN HCL 150 MG PO CAPS
300.0000 mg | ORAL_CAPSULE | Freq: Once | ORAL | Status: AC
  Administered 2025-01-06: 300 mg via ORAL
  Filled 2025-01-06: qty 2

## 2025-01-06 MED ORDER — OXYCODONE HCL 5 MG PO TABS: 5.0000 mg | ORAL_TABLET | Freq: Three times a day (TID) | ORAL | 0 refills | Status: AC | PRN

## 2025-01-06 MED ORDER — CLINDAMYCIN HCL 150 MG PO CAPS: 300.0000 mg | ORAL_CAPSULE | Freq: Three times a day (TID) | ORAL | 0 refills | Status: AC

## 2025-01-06 NOTE — ED Triage Notes (Signed)
 Pt comes with dental pain to right side. Pt states this started last night. Pt states pain radiates up to her face and is swollen.

## 2025-01-06 NOTE — ED Provider Notes (Signed)
 "  [facility] Provider Note    Event Date/Time   First MD Initiated Contact with Patient 01/06/25 1339     (approximate)   History   Dental Pain   HPI  Terri Hanna is a 49 y.o. female history of syphilis presents emergency department complaining of right sided dental pain.  Pain started last night.  States swelling along the right jaw.  Patient states she has a known cavity in this area and thinks she now has an abscess.  States took over-the-counter Tylenol and ibuprofen without any relief at all.  Denies fever, chills.  No chest pain or shortness of breath      Physical Exam   Triage Vital Signs: ED Triage Vitals  Encounter Vitals Group     BP 01/06/25 1308 111/76     Girls Systolic BP Percentile --      Girls Diastolic BP Percentile --      Boys Systolic BP Percentile --      Boys Diastolic BP Percentile --      Pulse Rate 01/06/25 1308 90     Resp 01/06/25 1308 18     Temp 01/06/25 1308 98.7 F (37.1 C)     Temp src --      SpO2 01/06/25 1308 100 %     Weight 01/06/25 1307 135 lb (61.2 kg)     Height 01/06/25 1307 5' 8 (1.727 m)     Head Circumference --      Peak Flow --      Pain Score 01/06/25 1307 9     Pain Loc --      Pain Education --      Exclude from Growth Chart --     Most recent vital signs: Vitals:   01/06/25 1308  BP: 111/76  Pulse: 90  Resp: 18  Temp: 98.7 F (37.1 C)  SpO2: 100%     General: Awake, no distress.   CV:  Good peripheral perfusion. regular rate and  rhythm Resp:  Normal effort. Lungs cta Abd:  No distention.   Other:  Right lower gum swelling, area is very tender to palpation, neck supple, no lymphadenopathy   ED Results / Procedures / Treatments   Labs (all labs ordered are listed, but only abnormal results are displayed) Labs Reviewed - No data to display   EKG     RADIOLOGY     PROCEDURES:   Procedures  Critical Care:  no Chief Complaint  Patient presents with    Dental Pain      MEDICATIONS ORDERED IN ED: Medications  clindamycin  (CLEOCIN ) capsule 300 mg (300 mg Oral Given 01/06/25 1358)  oxyCODONE  (Oxy IR/ROXICODONE ) immediate release tablet 5 mg (5 mg Oral Given 01/06/25 1358)     IMPRESSION / MDM / ASSESSMENT AND PLAN / ED COURSE  I reviewed the triage vital signs and the nursing notes.                              Differential diagnosis includes, but is not limited to, dental caries, dental abscess, dental pain  Patient's presentation is most consistent with acute illness / injury with system symptoms.    Medications given oxycodone  1 p.o., clindamycin  300 mg p.o.  I did offer to do a dental block for the patient.  She is deferring this at this time as she states she is very afraid of needles.  She was  given a prescription for clindamycin , and a few oxycodone .  She is to take over-the-counter Tylenol and ibuprofen.  List of dental clinics provided.  Return emergency department worsening.  Discharged stable condition.      FINAL CLINICAL IMPRESSION(S) / ED DIAGNOSES   Final diagnoses:  Dental abscess  Pain, dental     Rx / DC Orders   ED Discharge Orders          Ordered    clindamycin  (CLEOCIN ) 150 MG capsule  3 times daily        01/06/25 1347    oxyCODONE  (ROXICODONE ) 5 MG immediate release tablet  Every 8 hours PRN        01/06/25 1347             Note:  This document was prepared using Dragon voice recognition software and may include unintentional dictation errors.    Gasper Devere ORN, PA-C 01/06/25 1551  "

## 2025-01-06 NOTE — Discharge Instructions (Signed)
